# Patient Record
Sex: Male | Born: 1985 | Race: Black or African American | Hispanic: No | Marital: Single | Smoking: Former smoker
Health system: Southern US, Community
[De-identification: ages and names within clinical notes are randomized; demographics above are authoritative.]

## PROBLEM LIST (undated history)

## (undated) HISTORY — PX: KNEE ARTHROPLASTY: SHX992

---
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
## 2014-05-28 ENCOUNTER — Emergency Department (HOSPITAL_COMMUNITY): Payer: Managed Care, Other (non HMO)

## 2014-05-28 ENCOUNTER — Encounter (HOSPITAL_COMMUNITY): Payer: Self-pay | Admitting: *Deleted

## 2014-05-28 ENCOUNTER — Inpatient Hospital Stay (HOSPITAL_COMMUNITY)
Admission: EM | Admit: 2014-05-28 | Discharge: 2014-05-30 | DRG: 580 | Disposition: A | Discharge status: Home / Self Care | Payer: Managed Care, Other (non HMO) | Attending: General Surgery | Admitting: General Surgery

## 2014-05-28 DIAGNOSIS — S272XXA Traumatic hemopneumothorax, initial encounter: Secondary | ICD-10-CM

## 2014-05-28 DIAGNOSIS — J939 Pneumothorax, unspecified: Secondary | ICD-10-CM

## 2014-05-28 DIAGNOSIS — Y92410 Unspecified street and highway as the place of occurrence of the external cause: Secondary | ICD-10-CM | POA: Diagnosis not present

## 2014-05-28 DIAGNOSIS — S21312A Laceration without foreign body of left front wall of thorax with penetration into thoracic cavity, initial encounter: Secondary | ICD-10-CM

## 2014-05-28 DIAGNOSIS — S271XXA Traumatic hemothorax, initial encounter: Secondary | ICD-10-CM | POA: Diagnosis present

## 2014-05-28 DIAGNOSIS — S21319A Laceration without foreign body of unspecified front wall of thorax with penetration into thoracic cavity, initial encounter: Secondary | ICD-10-CM | POA: Diagnosis present

## 2014-05-28 DIAGNOSIS — D62 Acute posthemorrhagic anemia: Secondary | ICD-10-CM | POA: Diagnosis not present

## 2014-05-28 DIAGNOSIS — S21112A Laceration without foreign body of left front wall of thorax without penetration into thoracic cavity, initial encounter: Principal | ICD-10-CM | POA: Diagnosis present

## 2014-05-28 DIAGNOSIS — Z9889 Other specified postprocedural states: Secondary | ICD-10-CM

## 2014-05-28 DIAGNOSIS — W260XXA Contact with knife, initial encounter: Secondary | ICD-10-CM | POA: Diagnosis present

## 2014-05-28 LAB — BASIC METABOLIC PANEL
Anion gap: 6 (ref 5–15)
BUN: 10 mg/dL (ref 6–23)
CO2: 26 mmol/L (ref 19–32)
Calcium: 8.8 mg/dL (ref 8.4–10.5)
Chloride: 107 mmol/L (ref 96–112)
Creatinine, Ser: 1.05 mg/dL (ref 0.50–1.35)
GFR calc Af Amer: >90 mL/min (ref >90)
GFR calc non Af Amer: >90 mL/min (ref >90)
Glucose, Bld: 115 mg/dL — ABNORMAL HIGH (ref 70–99)
Potassium: 3.2 mmol/L — ABNORMAL LOW (ref 3.5–5.1)
Sodium: 139 mmol/L (ref 135–145)

## 2014-05-28 LAB — CBC WITH DIFFERENTIAL/PLATELET
Basophils Absolute: 0 10*3/uL (ref 0.0–0.1)
Basophils Relative: 0 % (ref 0–1)
Eosinophils Absolute: 0 10*3/uL (ref 0.0–0.7)
Eosinophils Relative: 0 % (ref 0–5)
HCT: 41.5 % (ref 39.0–52.0)
Hemoglobin: 14.1 g/dL (ref 13.0–17.0)
Lymphocytes Relative: 10 % — ABNORMAL LOW (ref 12–46)
Lymphs Abs: 1 10*3/uL (ref 0.7–4.0)
MCH: 31.1 pg (ref 26.0–34.0)
MCHC: 34 g/dL (ref 30.0–36.0)
MCV: 91.6 fL (ref 78.0–100.0)
Monocytes Absolute: 0.4 10*3/uL (ref 0.1–1.0)
Monocytes Relative: 4 % (ref 3–12)
Neutro Abs: 8.6 10*3/uL — ABNORMAL HIGH (ref 1.7–7.7)
Neutrophils Relative %: 86 % — ABNORMAL HIGH (ref 43–77)
Platelets: 208 10*3/uL (ref 150–400)
RBC: 4.53 MIL/uL (ref 4.22–5.81)
RDW: 12.3 % (ref 11.5–15.5)
WBC: 10 10*3/uL (ref 4.0–10.5)

## 2014-05-28 MED ORDER — MIDAZOLAM HCL 2 MG/2ML IJ SOLN
2.0000 mg | Freq: Once | INTRAMUSCULAR | Status: AC
  Filled 2014-05-28: qty 2

## 2014-05-28 MED ORDER — PANTOPRAZOLE SODIUM 40 MG IV SOLR
40.0000 mg | Freq: Every day | INTRAVENOUS | Status: DC
  Filled 2014-05-28: qty 40

## 2014-05-28 MED ORDER — FENTANYL CITRATE (PF) 100 MCG/2ML IJ SOLN
INTRAMUSCULAR | Status: AC | PRN
  Administered 2014-05-28: 50 ug via INTRAVENOUS

## 2014-05-28 MED ORDER — FENTANYL CITRATE (PF) 100 MCG/2ML IJ SOLN
INTRAMUSCULAR | Status: AC
  Filled 2014-05-28: qty 2

## 2014-05-28 MED ORDER — FENTANYL CITRATE (PF) 100 MCG/2ML IJ SOLN
100.0000 ug | Freq: Once | INTRAMUSCULAR | Status: AC
  Filled 2014-05-28: qty 2

## 2014-05-28 MED ORDER — MIDAZOLAM HCL 2 MG/2ML IJ SOLN
INTRAMUSCULAR | Status: AC | PRN
  Administered 2014-05-28: 2 mg via INTRAVENOUS

## 2014-05-28 MED ORDER — CEFAZOLIN SODIUM 1-5 GM-% IV SOLN
1.0000 g | Freq: Three times a day (TID) | INTRAVENOUS | Status: DC
  Administered 2014-05-28 – 2014-05-30 (×5): 1 g via INTRAVENOUS
  Filled 2014-05-28 (×9): qty 50

## 2014-05-28 MED ORDER — DOCUSATE SODIUM 100 MG PO CAPS
100.0000 mg | ORAL_CAPSULE | Freq: Two times a day (BID) | ORAL | Status: DC
  Administered 2014-05-28 – 2014-05-30 (×4): 100 mg via ORAL
  Filled 2014-05-28 (×5): qty 1

## 2014-05-28 MED ORDER — CEFAZOLIN SODIUM 1-5 GM-% IV SOLN
1.0000 g | Freq: Once | INTRAVENOUS | Status: AC
  Administered 2014-05-28: 1 g via INTRAVENOUS
  Filled 2014-05-28: qty 50

## 2014-05-28 MED ORDER — ONDANSETRON HCL 4 MG/2ML IJ SOLN
4.0000 mg | Freq: Four times a day (QID) | INTRAMUSCULAR | Status: DC | PRN
  Administered 2014-05-28 – 2014-05-29 (×2): 4 mg via INTRAVENOUS
  Filled 2014-05-28 (×2): qty 2

## 2014-05-28 MED ORDER — ACETAMINOPHEN 325 MG PO TABS: 650.0000 mg | ORAL_TABLET | ORAL | Status: DC | PRN

## 2014-05-28 MED ORDER — OXYCODONE HCL 5 MG PO TABS: 10.0000 mg | ORAL_TABLET | ORAL | Status: DC | PRN

## 2014-05-28 MED ORDER — SODIUM CHLORIDE 0.9 % IV SOLN
INTRAVENOUS | Status: DC
  Administered 2014-05-29: 03:00:00 via INTRAVENOUS

## 2014-05-28 MED ORDER — MORPHINE SULFATE 4 MG/ML IJ SOLN
4.0000 mg | Freq: Once | INTRAMUSCULAR | Status: AC
  Administered 2014-05-28: 4 mg via INTRAVENOUS
  Filled 2014-05-28: qty 1

## 2014-05-28 MED ORDER — BISACODYL 10 MG RE SUPP: 10.0000 mg | Freq: Every day | RECTAL | Status: DC | PRN

## 2014-05-28 MED ORDER — OXYCODONE HCL 5 MG PO TABS
5.0000 mg | ORAL_TABLET | ORAL | Status: DC | PRN
  Administered 2014-05-29: 5 mg via ORAL
  Filled 2014-05-28: qty 1

## 2014-05-28 MED ORDER — LIDOCAINE-EPINEPHRINE (PF) 2 %-1:200000 IJ SOLN
30.0000 mL | Freq: Once | INTRAMUSCULAR | Status: AC
  Administered 2014-05-28: 30 mL via INTRADERMAL
  Filled 2014-05-28: qty 40

## 2014-05-28 MED ORDER — MIDAZOLAM HCL 2 MG/2ML IJ SOLN
INTRAMUSCULAR | Status: AC
  Filled 2014-05-28: qty 2

## 2014-05-28 MED ORDER — PANTOPRAZOLE SODIUM 40 MG PO TBEC
40.0000 mg | DELAYED_RELEASE_TABLET | Freq: Every day | ORAL | Status: DC
  Administered 2014-05-28 – 2014-05-29 (×2): 40 mg via ORAL
  Filled 2014-05-28 (×3): qty 1

## 2014-05-28 MED ORDER — HYDROMORPHONE HCL 1 MG/ML IJ SOLN
1.0000 mg | INTRAMUSCULAR | Status: DC | PRN
  Administered 2014-05-28 – 2014-05-29 (×3): 2 mg via INTRAVENOUS
  Administered 2014-05-29: 1 mg via INTRAVENOUS
  Administered 2014-05-29 (×2): 2 mg via INTRAVENOUS
  Administered 2014-05-29 (×2): 1 mg via INTRAVENOUS
  Administered 2014-05-30 (×2): 2 mg via INTRAVENOUS
  Filled 2014-05-28: qty 1
  Filled 2014-05-28: qty 2
  Filled 2014-05-28: qty 1
  Filled 2014-05-28 (×4): qty 2
  Filled 2014-05-28: qty 1
  Filled 2014-05-28 (×2): qty 2

## 2014-05-28 NOTE — ED Notes (Signed)
Pt requesting to leave AMA, Dr Freida BusmanAllen in to speak with pt.

## 2014-05-28 NOTE — ED Notes (Signed)
DR Wyatt at bedside. 
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
## 2014-05-28 NOTE — ED Notes (Signed)
[facility] notified for transport, Spoke with C Chrisco, RN CN to make her aware pt is coming in to ED Dr Lindie SpruceWyatt is aware and has requested pt to be [facility].
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
## 2014-05-28 NOTE — ED Notes (Signed)
Bed: [id number] Expected date:  Expected time:  Means of arrival:  Comments: lac
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
## 2014-05-28 NOTE — Progress Notes (Signed)
Rec'd  pt via stretcher from ER on monitor and O2 @2L /min. Pt AAO x4. MAE. L chest tube patent. To wall suction draining dark red blood. VS obtained. CHG bath administered. MRSA swab obtained per protocol. Centralized telemetry notified of admission.
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
## 2014-05-28 NOTE — ED Notes (Signed)
Pt states he is feeling SHOB, able to take a deep breath and have normal conversation without distress noted.

## 2014-05-28 NOTE — ED Notes (Signed)
He is agitated and uncooperative.  He tells us repeatedly that he "just want to leave--I whouldn't have come her in the first place".  Dr. Freida BusmanAllen has just spoken with him, and so far he agrees to stay.  He declines to answer questions posed by our pharmacy tech.  He ambulates and speaks loudly without difficulty.  He refuses to allow me to assess him.  A young lady visitor is with him.
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
## 2014-05-28 NOTE — ED Notes (Signed)
Bleeding through dressing, removed and reapplied pressure dressing, pt in hurry to leave and would like to expedite service.
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
## 2014-05-28 NOTE — Procedures (Signed)
Chest Tube Insertion Procedure Note  Indications:  Clinically significant Pneumothorax and Hemothorax  Pre-operative Diagnosis: Pneumothorax and Hemothorax  Post-operative Diagnosis: Pneumothorax and Hemothorax  Procedure Details  Informed consent was obtained for the procedure, including sedation.  Risks of lung perforation, hemorrhage, arrhythmia, and adverse drug reaction were discussed.   After sterile skin prep, using standard technique, a 28 French tube was placed in the left anterolateral 6th rib space.  Findings: 150 ml of bloody fluid obtained  Estimated Blood Loss:  200 mL         Specimens:  None              Complications:  None; patient tolerated the procedure well.         Disposition: To be admitted from the ED to SDU when bed available'         Condition: stable  Attending Attestation: I performed the procedure.

## 2014-05-29 ENCOUNTER — Encounter (HOSPITAL_COMMUNITY): Payer: Self-pay | Admitting: Certified Registered Nurse Anesthetist

## 2014-05-29 ENCOUNTER — Inpatient Hospital Stay (HOSPITAL_COMMUNITY): Payer: Managed Care, Other (non HMO)

## 2014-05-29 LAB — CBC
HCT: 36 % — ABNORMAL LOW (ref 39.0–52.0)
Hemoglobin: 12 g/dL — ABNORMAL LOW (ref 13.0–17.0)
MCH: 30.5 pg (ref 26.0–34.0)
MCHC: 33.3 g/dL (ref 30.0–36.0)
MCV: 91.6 fL (ref 78.0–100.0)
Platelets: 189 10*3/uL (ref 150–400)
RBC: 3.93 MIL/uL — ABNORMAL LOW (ref 4.22–5.81)
RDW: 12.2 % (ref 11.5–15.5)
WBC: 11.3 10*3/uL — AB (ref 4.0–10.5)

## 2014-05-29 LAB — BASIC METABOLIC PANEL
ANION GAP: 8 (ref 5–15)
BUN: 5 mg/dL — AB (ref 6–23)
CALCIUM: 8.7 mg/dL (ref 8.4–10.5)
CHLORIDE: 103 mmol/L (ref 96–112)
CO2: 26 mmol/L (ref 19–32)
Creatinine, Ser: 1.13 mg/dL (ref 0.50–1.35)
GFR, EST NON AFRICAN AMERICAN: 87 mL/min — AB (ref >90)
GLUCOSE: 135 mg/dL — AB (ref 70–99)
Potassium: 3.8 mmol/L (ref 3.5–5.1)
SODIUM: 137 mmol/L (ref 135–145)

## 2014-05-29 LAB — MRSA PCR SCREENING: MRSA by PCR: NEGATIVE

## 2014-05-29 MED ORDER — KETOROLAC TROMETHAMINE 30 MG/ML IJ SOLN
INTRAMUSCULAR | Status: AC
  Administered 2014-05-29: 09:00:00
  Filled 2014-05-29: qty 1

## 2014-05-29 MED ORDER — KETOROLAC TROMETHAMINE 30 MG/ML IJ SOLN
30.0000 mg | Freq: Once | INTRAMUSCULAR | Status: AC
  Administered 2014-05-29: 30 mg via INTRAVENOUS

## 2014-05-29 MED ORDER — ZOLPIDEM TARTRATE 5 MG PO TABS
10.0000 mg | ORAL_TABLET | Freq: Once | ORAL | Status: AC
  Administered 2014-05-29: 10 mg via ORAL
  Filled 2014-05-29: qty 2

## 2014-05-29 MED ORDER — CETYLPYRIDINIUM CHLORIDE 0.05 % MT LIQD
7.0000 mL | Freq: Two times a day (BID) | OROMUCOSAL | Status: DC
  Administered 2014-05-29: 7 mL via OROMUCOSAL

## 2014-05-29 NOTE — Progress Notes (Signed)
Patient ID: Jimmy Morton H XXXEpps, male   DOB: Jun 03, 1985, 29 y.o.   MRN: [id number]  Pain at CT site as expected No SOB Hemodynamically stable  Plan: Check pCXR Transfer to floor
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
## 2014-05-29 NOTE — Progress Notes (Signed)
Patient to transfer to 6N01 report given to receiving nurse, all questions answered at this time.  Pt. VSS with no s/s of distress noted.  Pt. Stable for transfer.

## 2014-05-29 NOTE — Progress Notes (Signed)
Pt transferred via bed to 3S13 as per MD orders. On O2 at 2L and on monitor. Report given to [facility] RN. L  CT remains intact and patent. Total output of chest tube since insertion=400cc

## 2014-05-30 ENCOUNTER — Inpatient Hospital Stay (HOSPITAL_COMMUNITY): Payer: Managed Care, Other (non HMO)

## 2014-05-30 DIAGNOSIS — S272XXA Traumatic hemopneumothorax, initial encounter: Secondary | ICD-10-CM | POA: Diagnosis present

## 2014-05-30 DIAGNOSIS — D62 Acute posthemorrhagic anemia: Secondary | ICD-10-CM | POA: Diagnosis not present

## 2014-05-30 MED ORDER — HYDROCODONE-ACETAMINOPHEN 5-325 MG PO TABS
1.0000 | ORAL_TABLET | ORAL | Status: DC | PRN
Start: 2014-05-30 — End: 2014-05-30
  Administered 2014-05-30 (×2): 2 via ORAL
  Filled 2014-05-30 (×2): qty 2

## 2014-05-30 MED ORDER — DIPHENHYDRAMINE HCL 25 MG PO CAPS
25.0000 mg | ORAL_CAPSULE | Freq: Four times a day (QID) | ORAL | Status: DC | PRN
  Administered 2014-05-30 (×2): 25 mg via ORAL
  Filled 2014-05-30 (×2): qty 1

## 2014-05-30 MED ORDER — HYDROCODONE-ACETAMINOPHEN 5-325 MG PO TABS: 1.0000 | ORAL_TABLET | ORAL | Status: AC | PRN
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
## 2014-05-30 NOTE — Discharge Instructions (Signed)
Leave chest dressing on until 4/27. Then Wash wounds daily in shower with soap and water. Do not soak. Apply antibiotic ointment (e.g. Neosporin) twice daily and as needed to keep moist. Cover with dry dressing.  No driving while taking hydrocodone.
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
## 2014-06-02 ENCOUNTER — Telehealth (HOSPITAL_COMMUNITY): Payer: Self-pay
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
